# Patient Record
Sex: Female | Born: 1940 | Race: White | Hispanic: No | Marital: Married | Smoking: Never smoker
Health system: Southern US, Community
[De-identification: ages and names within clinical notes are randomized; demographics above are authoritative.]

## PROBLEM LIST (undated history)

## (undated) DIAGNOSIS — E119 Type 2 diabetes mellitus without complications: Secondary | ICD-10-CM

## (undated) DIAGNOSIS — I10 Essential (primary) hypertension: Secondary | ICD-10-CM

## (undated) HISTORY — DX: Essential (primary) hypertension: I10

## (undated) HISTORY — DX: Type 2 diabetes mellitus without complications: E11.9

---

## 2000-06-28 ENCOUNTER — Encounter: Admission: RE | Admit: 2000-06-28 | Discharge: 2000-06-28 | Payer: Self-pay | Admitting: Obstetrics and Gynecology

## 2000-06-28 ENCOUNTER — Encounter: Payer: Self-pay | Admitting: Obstetrics and Gynecology

## 2000-09-27 ENCOUNTER — Ambulatory Visit (HOSPITAL_BASED_OUTPATIENT_CLINIC_OR_DEPARTMENT_OTHER): Admission: RE | Admit: 2000-09-27 | Discharge: 2000-09-27 | Payer: Self-pay | Admitting: *Deleted

## 2001-08-08 ENCOUNTER — Encounter: Admission: RE | Admit: 2001-08-08 | Discharge: 2001-08-08 | Payer: Self-pay | Admitting: Obstetrics and Gynecology

## 2001-08-08 ENCOUNTER — Encounter: Payer: Self-pay | Admitting: Obstetrics and Gynecology

## 2001-12-12 ENCOUNTER — Ambulatory Visit (HOSPITAL_BASED_OUTPATIENT_CLINIC_OR_DEPARTMENT_OTHER): Admission: RE | Admit: 2001-12-12 | Discharge: 2001-12-12 | Payer: Self-pay | Admitting: *Deleted

## 2003-03-01 ENCOUNTER — Encounter: Admission: RE | Admit: 2003-03-01 | Discharge: 2003-03-01 | Payer: Self-pay | Admitting: Obstetrics and Gynecology

## 2003-03-01 ENCOUNTER — Encounter: Payer: Self-pay | Admitting: Obstetrics and Gynecology

## 2004-06-30 ENCOUNTER — Encounter: Admission: RE | Admit: 2004-06-30 | Discharge: 2004-06-30 | Payer: Self-pay | Admitting: Obstetrics and Gynecology

## 2004-09-25 ENCOUNTER — Ambulatory Visit (HOSPITAL_COMMUNITY): Admission: RE | Admit: 2004-09-25 | Discharge: 2004-09-25 | Payer: Self-pay | Admitting: Gastroenterology

## 2005-06-29 ENCOUNTER — Encounter: Admission: RE | Admit: 2005-06-29 | Discharge: 2005-06-29 | Payer: Self-pay | Admitting: Obstetrics and Gynecology

## 2006-05-06 ENCOUNTER — Encounter: Admission: RE | Admit: 2006-05-06 | Discharge: 2006-05-06 | Payer: Self-pay | Admitting: Obstetrics and Gynecology

## 2007-06-02 ENCOUNTER — Encounter: Admission: RE | Admit: 2007-06-02 | Discharge: 2007-06-02 | Payer: Self-pay | Admitting: Obstetrics and Gynecology

## 2008-05-31 ENCOUNTER — Encounter: Admission: RE | Admit: 2008-05-31 | Discharge: 2008-05-31 | Payer: Self-pay

## 2009-06-07 ENCOUNTER — Encounter: Admission: RE | Admit: 2009-06-07 | Discharge: 2009-06-07 | Payer: Self-pay

## 2010-05-08 ENCOUNTER — Encounter: Admission: RE | Admit: 2010-05-08 | Discharge: 2010-05-08 | Payer: Self-pay

## 2011-04-13 ENCOUNTER — Other Ambulatory Visit: Payer: Self-pay | Admitting: Family Medicine

## 2011-04-13 DIAGNOSIS — Z1231 Encounter for screening mammogram for malignant neoplasm of breast: Secondary | ICD-10-CM

## 2011-04-16 ENCOUNTER — Ambulatory Visit
Admission: RE | Admit: 2011-04-16 | Discharge: 2011-04-16 | Disposition: A | Discharge status: Home / Self Care | Payer: BC Managed Care – PPO | Source: Ambulatory Visit | Attending: Family Medicine | Admitting: Family Medicine

## 2011-04-16 DIAGNOSIS — Z1231 Encounter for screening mammogram for malignant neoplasm of breast: Secondary | ICD-10-CM

## 2011-05-11 NOTE — Op Note (Signed)
[place]. [facility]  Patient:    TALON, WITTING Visit Number: [id number] MRN: [id number]          Service Type: DSU Location: [facility] Attending Physician:  Aundria Mems Dictated by:   Kathy Breach, M.D. Proc. 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  OPERATION:  Ossicular reconstruction with a partial ossicular replacement prosthesis AD.  SURGEON:  Kathy Breach, M.D.  DESCRIPTION OF PROCEDURE:  The patient under general orotracheal anesthesia, the right ear was prepped and draped in sterile fashion.  The patient had a fairly small canal and examination of the tympanic membrane revealed normal-appearing anterior tympanic membrane.  The adherent previous incus rotation graft could be seen on the undersurface of the posterior tympanic membrane.  There was a 1 mm pinhole perforation at 9 oclock near the annulus.  An incision was made in the premargins of the ear lobe and a large piece of fat harvested and pressed for a press fat graft anticipated use.  The external canal previously infiltrated with 1% Xylocaine, 1:100,000 epinephrine for vasal constriction.  Tympanotomy flap was elevated in a posterior tympanum.  Adhesions were carefully removed.  The previous incus body rotation adherent on the undersurface of the tympanic membrane lifted up off of the stapes capitulum. It was dissected from the tympanic membrane resulting in a 2 mm perforation of the tympanic membrane where this was dissected away.  All middle ear adhesions were released.  Measurement of stapes capitulum to the posterior-superior bony annulus was about 4.5 mm.  This was sized with a tinfoil strip.  An [employer] shafted golden  burred PORP was selected, and under operating microscope for visualization, cut to size of the foil strip angulating the cut of the shaft slightly shorter towards the more sizeable portion of the superior head of the PORP.  The pressed fat graft was then inserted on the undersurface of the tympanic membrane spanning the perforation site from excision of the ossicle adherent to the undersurface of the tympanic membrane as well as the small pinhole perforation preexisting.  The PORP was then inserted, rotated into place visualizing through the shaft in place over the stapes capitulum.  The fat graft across the tympanic membrane perforation remained in position to visualization and checking.  The middle ear space was packed inferiorly and posteriorly with Gelfoam pledgets soaked with saline.  Tympanotomy flap turned back in position, noting slight fullness over the head of the PORP which was also occluding and holding the fat graft in position spanning the tympanic membrane perforation from dissecting the previous incus rotation away.  This was stabilized, packing the external canal with Gelfoam soaked with saline.  Sterile fluff cotton was placed in the external meatus.  The small incision of the ear lobe was repaired with interrupted 4-0 chromic catgut sutures.  The patient tolerated the procedure well.  She was taken to the stable, general condition.Dictated by:   Kathy Breach, M.D. Attending Physician:  Aundria Mems DD:  12/12/01 TD:  12/14/01 Job: [id number]

## 2011-05-11 NOTE — Op Note (Signed)
[name], Marshall               ACCOUNT NO.:  [id number]   MEDICAL RECORD NO.:  [id number]          PATIENT TYPE:  AMB   LOCATION:  ENDO                         FACILITY:  [facility]   PHYSICIAN:  Petra Kuba, M.D.    DATE OF BIRTH:  Mar 06, 1941   DATE OF PROCEDURE:  09/25/2004  DATE OF DISCHARGE:                                 OPERATIVE REPORT   PROCEDURE:  Colonoscopy.   INDICATIONS FOR PROCEDURE:  Guaiac positivity, family history of colon  cancer, due for colonic screening.   MEDICATIONS:  Demerol 60, Versed 6.   DESCRIPTION OF PROCEDURE:  Rectal inspection is pertinent for external  hemorrhoids.  Digital examination is negative.  The video colonoscope was  inserted and with rolling her on her back and abdominal pressure, fairly  easily advanced around the colon to the cecum.  On insertion, some left-  sided diverticuli were seen, but no other abnormality or sign of bleeding.  The cecum was identified by the appendiceal orifice and ileocecal valve.  The scope was slowly withdrawn.  The prep was adequate.  There was some  liquid stool that required washing and suctioning.  Cecum, ascending, and  transverse were normal.  The scope was withdrawn around the left side of the  colon and occasional diverticuli were seen, but no signs of bleeding,  polyps, or masses.  Anorectal pullthrough and retroflexion confirmed some  small hemorrhoids in the rectum.  The scope was straightened and readvanced  a short ways up the left side of the colon.  Air was suctioned and the scope  was removed.  The patient tolerated the procedure well.  There was no  obvious immediate complications.   ENDOSCOPIC ASSESSMENT:  1.  Internal and external hemorrhoids.  2.  Occasional left-sided diverticula.  3.  Otherwise within normal limits to the cecum.   PLAN:  Follow-up p.r.n. or in two months to recheck guaiacs.  Recheck colon  screening in five years.  Otherwise return care to S. Kyra Manges, M.D.  for  the customary health care maintenance and screening.       MEM/MEDQ  D:  09/25/2004  T:  09/25/2004  Job:  [id number]   cc:   S. Kyra Manges, M.D.  [phone] [address]  [place]  [place] [address]  Fax: [phone]

## 2011-05-11 NOTE — Op Note (Signed)
[place]  Patient:    Tiffany Marshall, Tiffany Marshall                      MRN: [id number] Proc. Date: 09/27/00 Adm. Date:  [id number] Attending:  Aundria Mems                           Operative Report  PREOPERATIVE DIAGNOSIS:  Ossicular discontinuity AD, with maximal conductive hearing loss status post previously and initially successful incus autograft ossicular reconstruction in 1998.  OPERATIVE PROCEDURE:  Revision incus autograft ossicular reconstruction AD.  POSTOPERATIVE DIAGNOSIS:  Ossicular discontinuity AD, with maximal conductive hearing loss status post previously and initially successful incus autograft ossicular reconstruction in 1998.  DESCRIPTION OF PROCEDURE:  With the patient under general aural tract anesthesia, the right ear is prepped and draped in a sterile fashion.  The canal skin was then infiltrated with 1% Xylocaine and 1:100,000 epinephrine. After careful examination of the small external canal, we were able to utilize a 5.5 mm speculum with no significant retraction, though thickened appearance of the posterosuperior quadrant of the tympanic membrane.  Tympanotomy flap was made from 12 oclock superiorly and 7 oclock inferiorly and elevated entering the middle ear space.  There was no remaining chorda tympani nerve visible.  The previous incus rotation-type ossicular reconstruction was noted to appear to have slipped superiorly off of the stapes capitulum that was dissected off of the undersurface of the tympanic membrane and it appeared as though that the short process of the incus had also completely eroded away, which had been positioned anteriorly.  The remaining head of the incus was removed after being completely dissected free.  The stapes was intact and freely mobile.  In elevating the tympanotomy flap, a linear tear from about 7 oclock towards the umbo occurred in the tympanic membrane.  A globule of fat was taken from  the right earlobe and pressed for later use in reinforcing the area of the tympanic membrane tear.  The measurement from the stapes capitulum to the overlying malleus long process was less than 3 mm and the position of malleus was just anterior to the capitulum, merely overlying it. The remaining incus graft could then be repositioned as a direct strut from the stapes capitulum to the undersurface of the upper portion of the malleus and was positioned as such and appeared to hold its position spontaneously and its position wedged between the undersurface of the malleus and the stapes capitulum.  The posterior superior and posterior anterior middle ear space was then packed with small pledgets of Gelfoam soaked with Cortisporin suspension. The pressed fat graft after being pressed gave the appearance of a nice thin fascia graft and was layed on the Gelfoam bed underneath the tear in the tympanic membrane for reinforcement and flap was returned back into its position and stabilized, packing external canal with Gelfoam pledgets soaked with Cortisporin suspension.  Sterile cotton was placed in the external meatus.  Cortisporin ointment was applied to the donor site incision of the earlobe, which was closed with interrupted 4-0 chromic catgut sutures.  The patient tolerated the procedure well and was taken to the recovery room in stable general condition. DD:  09/27/00 TD:  09/29/00 Job: [id number]

## 2012-03-14 ENCOUNTER — Other Ambulatory Visit: Payer: Self-pay | Admitting: Family Medicine

## 2012-03-14 DIAGNOSIS — Z1231 Encounter for screening mammogram for malignant neoplasm of breast: Secondary | ICD-10-CM

## 2012-03-21 ENCOUNTER — Ambulatory Visit
Admission: RE | Admit: 2012-03-21 | Discharge: 2012-03-21 | Disposition: A | Discharge status: Home / Self Care | Payer: BC Managed Care – PPO | Source: Ambulatory Visit | Attending: Family Medicine | Admitting: Family Medicine

## 2012-03-21 DIAGNOSIS — Z1231 Encounter for screening mammogram for malignant neoplasm of breast: Secondary | ICD-10-CM

## 2013-03-11 ENCOUNTER — Other Ambulatory Visit: Payer: Self-pay

## 2013-03-11 DIAGNOSIS — Z1231 Encounter for screening mammogram for malignant neoplasm of breast: Secondary | ICD-10-CM

## 2013-04-07 ENCOUNTER — Ambulatory Visit
Admission: RE | Admit: 2013-04-07 | Discharge: 2013-04-07 | Disposition: A | Discharge status: Home / Self Care | Payer: BC Managed Care – PPO | Source: Ambulatory Visit

## 2013-04-07 DIAGNOSIS — Z1231 Encounter for screening mammogram for malignant neoplasm of breast: Secondary | ICD-10-CM

## 2014-04-26 ENCOUNTER — Other Ambulatory Visit: Payer: Self-pay

## 2014-04-26 DIAGNOSIS — Z1231 Encounter for screening mammogram for malignant neoplasm of breast: Secondary | ICD-10-CM

## 2014-05-03 ENCOUNTER — Ambulatory Visit
Admission: RE | Admit: 2014-05-03 | Discharge: 2014-05-03 | Disposition: A | Discharge status: Home / Self Care | Payer: BC Managed Care – PPO | Source: Ambulatory Visit

## 2014-05-03 DIAGNOSIS — Z1231 Encounter for screening mammogram for malignant neoplasm of breast: Secondary | ICD-10-CM

## 2014-09-21 ENCOUNTER — Ambulatory Visit: Payer: Self-pay | Admitting: Podiatrist

## 2014-10-12 ENCOUNTER — Ambulatory Visit (INDEPENDENT_AMBULATORY_CARE_PROVIDER_SITE_OTHER): Payer: BC Managed Care – PPO

## 2014-10-12 ENCOUNTER — Encounter: Payer: Self-pay | Admitting: Podiatrist

## 2014-10-12 ENCOUNTER — Ambulatory Visit (INDEPENDENT_AMBULATORY_CARE_PROVIDER_SITE_OTHER): Payer: BC Managed Care – PPO | Admitting: Podiatrist

## 2014-10-12 VITALS — BP 131/66 | HR 67 | Resp 12

## 2014-10-12 DIAGNOSIS — M65271 Calcific tendinitis, right ankle and foot: Secondary | ICD-10-CM

## 2014-10-12 DIAGNOSIS — M722 Plantar fascial fibromatosis: Secondary | ICD-10-CM

## 2014-10-12 DIAGNOSIS — R52 Pain, unspecified: Secondary | ICD-10-CM

## 2014-10-12 MED ORDER — DICLOFENAC SODIUM 1 % TD GEL
2.0000 g | Freq: Four times a day (QID) | TRANSDERMAL | Status: AC
Start: 2014-10-12 — End: ?

## 2014-10-12 NOTE — Patient Instructions (Signed)

## 2014-10-12 NOTE — Progress Notes (Signed)
   Subjective:    Patient ID: Tiffany Marshall, female    DOB: July 06, 1941, 73 y.o.   MRN: [id number]  HPI  PT STATED RT OUTSIDE  AND ARCH OF THE FOOT IS SORE FOR 3 MONTHS.  FOOT IS GETTING BETTER AND GET AGGRAVATED BY WALKING. TRIED TAKING PREDNISONE AND IT HELP   Review of Systems  All other systems reviewed and are negative.      Objective:   Physical Exam  Patient is awake, alert, and oriented x 3.  In no acute distress.  Vascular status is intact with palpable pedal pulses at 2/4 DP and PT bilateral and capillary refill time within normal limits. Neurological sensation is also intact bilaterally via Semmes Weinstein monofilament at 5/5 sites. Light touch, vibratory sensation, Achilles tendon reflex is intact. Dermatological exam reveals skin color, turger and texture as normal. No open lesions present.  Musculature intact with dorsiflexion, plantarflexion, inversion, eversion.  Pes planus foot type is noted.  Mild discomfort on the lateral aspect of the right foot is noted likely from compensation from her arch pain that improved after taking a steroid pack.  No weakness or tenderness along the peroneus brevis is noted.    xrays show a small enthesopathy on the base of the fifth metatarsal.  Minimal spur on the plantar heel is noted.  Pes planus foot type is seen.      Assessment & Plan:  Tendonitis, capsulitis, compensation right foot lateral  Plan:  Recommended stretching, good shoes, and voltaren gel.  At this time it does not warrant an injection.  She will call if the pain returns.

## 2015-06-22 ENCOUNTER — Other Ambulatory Visit: Payer: Self-pay

## 2015-06-22 DIAGNOSIS — Z1231 Encounter for screening mammogram for malignant neoplasm of breast: Secondary | ICD-10-CM

## 2015-06-30 ENCOUNTER — Ambulatory Visit: Payer: Self-pay

## 2015-06-30 ENCOUNTER — Ambulatory Visit
Admission: RE | Admit: 2015-06-30 | Discharge: 2015-06-30 | Disposition: A | Discharge status: Home / Self Care | Payer: BLUE CROSS/BLUE SHIELD | Source: Ambulatory Visit

## 2015-06-30 DIAGNOSIS — Z1231 Encounter for screening mammogram for malignant neoplasm of breast: Secondary | ICD-10-CM

## 2016-06-04 ENCOUNTER — Other Ambulatory Visit: Payer: Self-pay | Admitting: Sports Medicine

## 2016-06-04 ENCOUNTER — Ambulatory Visit
Admission: RE | Admit: 2016-06-04 | Discharge: 2016-06-04 | Disposition: A | Discharge status: Home / Self Care | Payer: BLUE CROSS/BLUE SHIELD | Source: Ambulatory Visit | Attending: Family Medicine | Admitting: Family Medicine

## 2016-06-04 ENCOUNTER — Other Ambulatory Visit: Payer: Self-pay | Admitting: Family Medicine

## 2016-06-04 DIAGNOSIS — Z1231 Encounter for screening mammogram for malignant neoplasm of breast: Secondary | ICD-10-CM

## 2017-03-08 DIAGNOSIS — Y93H2 Activity, gardening and landscaping: Secondary | ICD-10-CM | POA: Diagnosis not present

## 2017-03-08 DIAGNOSIS — S4991XA Unspecified injury of right shoulder and upper arm, initial encounter: Secondary | ICD-10-CM | POA: Diagnosis not present

## 2017-03-08 DIAGNOSIS — W19XXXA Unspecified fall, initial encounter: Secondary | ICD-10-CM | POA: Diagnosis not present

## 2017-03-08 DIAGNOSIS — M79601 Pain in right arm: Secondary | ICD-10-CM | POA: Diagnosis not present

## 2017-03-08 DIAGNOSIS — S01411A Laceration without foreign body of right cheek and temporomandibular area, initial encounter: Secondary | ICD-10-CM | POA: Diagnosis not present

## 2017-03-08 DIAGNOSIS — S0121XA Laceration without foreign body of nose, initial encounter: Secondary | ICD-10-CM | POA: Diagnosis not present

## 2017-05-22 ENCOUNTER — Other Ambulatory Visit: Payer: Self-pay | Admitting: Family Medicine

## 2017-05-22 DIAGNOSIS — Z1231 Encounter for screening mammogram for malignant neoplasm of breast: Secondary | ICD-10-CM

## 2017-06-11 ENCOUNTER — Ambulatory Visit
Admission: RE | Admit: 2017-06-11 | Discharge: 2017-06-11 | Disposition: A | Discharge status: Home / Self Care | Payer: PPO | Source: Ambulatory Visit | Attending: Family Medicine | Admitting: Family Medicine

## 2017-06-11 DIAGNOSIS — Z1231 Encounter for screening mammogram for malignant neoplasm of breast: Secondary | ICD-10-CM

## 2017-07-03 ENCOUNTER — Other Ambulatory Visit: Payer: Self-pay

## 2017-07-03 NOTE — Patient Outreach (Signed)
[employer] questionnaire screening:  07/02/2017 Placed call to patient for screening. Explained reason for call. Confirmed identity.  Reviewed answers to questionnaire. Discussed DM with patient. Patient reports that she is pre diabetes. States that she is managed by diet. Denies taking any medications for DM. States that her CBG runs in the 100's.  Hypertension:  Patient reports that she has no signs or symptoms of high blood pressure. States that she does not home monitor.   Medications: denies any problems or concerns with current medications.   Reviewed any current and patient states that she is doing well.  Offered to mail letter with magnet and patient has agreed.  Confirmed address. Encouraged patient to call [employer] for any needs in the future. She agreed.  Tomasa Rand, RN, BSN, C[facility] [employer] [phone]

## 2017-10-07 DIAGNOSIS — H8081 Other otosclerosis, right ear: Secondary | ICD-10-CM | POA: Diagnosis not present

## 2017-10-07 DIAGNOSIS — H903 Sensorineural hearing loss, bilateral: Secondary | ICD-10-CM | POA: Diagnosis not present

## 2017-10-07 DIAGNOSIS — H73891 Other specified disorders of tympanic membrane, right ear: Secondary | ICD-10-CM | POA: Diagnosis not present

## 2017-10-07 DIAGNOSIS — H8001 Otosclerosis involving oval window, nonobliterative, right ear: Secondary | ICD-10-CM | POA: Diagnosis not present

## 2017-10-15 DIAGNOSIS — Z800 Family history of malignant neoplasm of digestive organs: Secondary | ICD-10-CM | POA: Diagnosis not present

## 2017-10-15 DIAGNOSIS — K625 Hemorrhage of anus and rectum: Secondary | ICD-10-CM | POA: Diagnosis not present

## 2017-10-15 DIAGNOSIS — R198 Other specified symptoms and signs involving the digestive system and abdomen: Secondary | ICD-10-CM | POA: Diagnosis not present

## 2017-10-28 DIAGNOSIS — E669 Obesity, unspecified: Secondary | ICD-10-CM | POA: Diagnosis not present

## 2017-10-28 DIAGNOSIS — K648 Other hemorrhoids: Secondary | ICD-10-CM | POA: Diagnosis not present

## 2017-10-28 DIAGNOSIS — E785 Hyperlipidemia, unspecified: Secondary | ICD-10-CM | POA: Diagnosis not present

## 2017-10-28 DIAGNOSIS — K649 Unspecified hemorrhoids: Secondary | ICD-10-CM | POA: Diagnosis not present

## 2017-10-28 DIAGNOSIS — Z79899 Other long term (current) drug therapy: Secondary | ICD-10-CM | POA: Diagnosis not present

## 2017-10-28 DIAGNOSIS — K5120 Ulcerative (chronic) proctitis without complications: Secondary | ICD-10-CM | POA: Diagnosis not present

## 2017-10-28 DIAGNOSIS — R198 Other specified symptoms and signs involving the digestive system and abdomen: Secondary | ICD-10-CM | POA: Diagnosis not present

## 2017-10-28 DIAGNOSIS — K625 Hemorrhage of anus and rectum: Secondary | ICD-10-CM | POA: Diagnosis not present

## 2017-10-28 DIAGNOSIS — E119 Type 2 diabetes mellitus without complications: Secondary | ICD-10-CM | POA: Diagnosis not present

## 2017-10-28 DIAGNOSIS — Z800 Family history of malignant neoplasm of digestive organs: Secondary | ICD-10-CM | POA: Diagnosis not present

## 2017-10-28 DIAGNOSIS — K645 Perianal venous thrombosis: Secondary | ICD-10-CM | POA: Diagnosis not present

## 2017-10-28 DIAGNOSIS — Z6837 Body mass index (BMI) 37.0-37.9, adult: Secondary | ICD-10-CM | POA: Diagnosis not present

## 2017-10-28 DIAGNOSIS — Z86010 Personal history of colonic polyps: Secondary | ICD-10-CM | POA: Diagnosis not present

## 2017-10-28 DIAGNOSIS — K5730 Diverticulosis of large intestine without perforation or abscess without bleeding: Secondary | ICD-10-CM | POA: Diagnosis not present

## 2017-10-28 DIAGNOSIS — I10 Essential (primary) hypertension: Secondary | ICD-10-CM | POA: Diagnosis not present

## 2017-10-31 DIAGNOSIS — Z9181 History of falling: Secondary | ICD-10-CM | POA: Diagnosis not present

## 2017-10-31 DIAGNOSIS — E559 Vitamin D deficiency, unspecified: Secondary | ICD-10-CM | POA: Diagnosis not present

## 2017-10-31 DIAGNOSIS — R8290 Unspecified abnormal findings in urine: Secondary | ICD-10-CM | POA: Diagnosis not present

## 2017-10-31 DIAGNOSIS — E785 Hyperlipidemia, unspecified: Secondary | ICD-10-CM | POA: Diagnosis not present

## 2017-10-31 DIAGNOSIS — N182 Chronic kidney disease, stage 2 (mild): Secondary | ICD-10-CM | POA: Diagnosis not present

## 2017-10-31 DIAGNOSIS — Z1389 Encounter for screening for other disorder: Secondary | ICD-10-CM | POA: Diagnosis not present

## 2017-10-31 DIAGNOSIS — Z0000 Encounter for general adult medical examination without abnormal findings: Secondary | ICD-10-CM | POA: Diagnosis not present

## 2017-10-31 DIAGNOSIS — I129 Hypertensive chronic kidney disease with stage 1 through stage 4 chronic kidney disease, or unspecified chronic kidney disease: Secondary | ICD-10-CM | POA: Diagnosis not present

## 2017-10-31 DIAGNOSIS — Z1331 Encounter for screening for depression: Secondary | ICD-10-CM | POA: Diagnosis not present

## 2017-10-31 DIAGNOSIS — E1122 Type 2 diabetes mellitus with diabetic chronic kidney disease: Secondary | ICD-10-CM | POA: Diagnosis not present

## 2017-10-31 DIAGNOSIS — E669 Obesity, unspecified: Secondary | ICD-10-CM | POA: Diagnosis not present

## 2017-11-07 DIAGNOSIS — L84 Corns and callosities: Secondary | ICD-10-CM | POA: Diagnosis not present

## 2017-11-07 DIAGNOSIS — L600 Ingrowing nail: Secondary | ICD-10-CM | POA: Diagnosis not present

## 2017-11-11 DIAGNOSIS — N182 Chronic kidney disease, stage 2 (mild): Secondary | ICD-10-CM | POA: Diagnosis not present

## 2017-11-11 DIAGNOSIS — I129 Hypertensive chronic kidney disease with stage 1 through stage 4 chronic kidney disease, or unspecified chronic kidney disease: Secondary | ICD-10-CM | POA: Diagnosis not present

## 2017-11-11 DIAGNOSIS — E559 Vitamin D deficiency, unspecified: Secondary | ICD-10-CM | POA: Diagnosis not present

## 2017-11-11 DIAGNOSIS — E785 Hyperlipidemia, unspecified: Secondary | ICD-10-CM | POA: Diagnosis not present

## 2017-11-11 DIAGNOSIS — E1122 Type 2 diabetes mellitus with diabetic chronic kidney disease: Secondary | ICD-10-CM | POA: Diagnosis not present

## 2017-11-21 DIAGNOSIS — H8001 Otosclerosis involving oval window, nonobliterative, right ear: Secondary | ICD-10-CM | POA: Diagnosis not present

## 2017-11-21 DIAGNOSIS — H73891 Other specified disorders of tympanic membrane, right ear: Secondary | ICD-10-CM | POA: Diagnosis not present

## 2017-11-21 DIAGNOSIS — H903 Sensorineural hearing loss, bilateral: Secondary | ICD-10-CM | POA: Diagnosis not present

## 2017-11-21 DIAGNOSIS — H8081 Other otosclerosis, right ear: Secondary | ICD-10-CM | POA: Diagnosis not present

## 2017-12-05 DIAGNOSIS — L600 Ingrowing nail: Secondary | ICD-10-CM | POA: Diagnosis not present

## 2018-06-19 DIAGNOSIS — E785 Hyperlipidemia, unspecified: Secondary | ICD-10-CM | POA: Diagnosis not present

## 2018-06-19 DIAGNOSIS — Z1231 Encounter for screening mammogram for malignant neoplasm of breast: Secondary | ICD-10-CM | POA: Diagnosis not present

## 2018-06-19 DIAGNOSIS — I129 Hypertensive chronic kidney disease with stage 1 through stage 4 chronic kidney disease, or unspecified chronic kidney disease: Secondary | ICD-10-CM | POA: Diagnosis not present

## 2018-06-19 DIAGNOSIS — E559 Vitamin D deficiency, unspecified: Secondary | ICD-10-CM | POA: Diagnosis not present

## 2018-06-19 DIAGNOSIS — E1122 Type 2 diabetes mellitus with diabetic chronic kidney disease: Secondary | ICD-10-CM | POA: Diagnosis not present

## 2018-06-27 ENCOUNTER — Other Ambulatory Visit: Payer: Self-pay | Admitting: Family Medicine

## 2018-06-27 DIAGNOSIS — Z1231 Encounter for screening mammogram for malignant neoplasm of breast: Secondary | ICD-10-CM

## 2018-07-10 DIAGNOSIS — H20031 Secondary infectious iridocyclitis, right eye: Secondary | ICD-10-CM | POA: Diagnosis not present

## 2018-07-11 DIAGNOSIS — H20031 Secondary infectious iridocyclitis, right eye: Secondary | ICD-10-CM | POA: Diagnosis not present

## 2018-07-14 DIAGNOSIS — S0501XA Injury of conjunctiva and corneal abrasion without foreign body, right eye, initial encounter: Secondary | ICD-10-CM | POA: Diagnosis not present

## 2018-07-21 ENCOUNTER — Ambulatory Visit
Admission: RE | Admit: 2018-07-21 | Discharge: 2018-07-21 | Disposition: A | Discharge status: Home / Self Care | Payer: PPO | Source: Ambulatory Visit | Attending: Family Medicine | Admitting: Family Medicine

## 2018-07-21 DIAGNOSIS — Z1231 Encounter for screening mammogram for malignant neoplasm of breast: Secondary | ICD-10-CM

## 2018-09-29 DIAGNOSIS — N390 Urinary tract infection, site not specified: Secondary | ICD-10-CM | POA: Diagnosis not present

## 2018-09-29 DIAGNOSIS — R05 Cough: Secondary | ICD-10-CM | POA: Diagnosis not present

## 2018-09-29 DIAGNOSIS — E559 Vitamin D deficiency, unspecified: Secondary | ICD-10-CM | POA: Diagnosis not present

## 2018-09-29 DIAGNOSIS — E785 Hyperlipidemia, unspecified: Secondary | ICD-10-CM | POA: Diagnosis not present

## 2018-09-29 DIAGNOSIS — Z23 Encounter for immunization: Secondary | ICD-10-CM | POA: Diagnosis not present

## 2018-09-29 DIAGNOSIS — Z1339 Encounter for screening examination for other mental health and behavioral disorders: Secondary | ICD-10-CM | POA: Diagnosis not present

## 2018-09-29 DIAGNOSIS — I129 Hypertensive chronic kidney disease with stage 1 through stage 4 chronic kidney disease, or unspecified chronic kidney disease: Secondary | ICD-10-CM | POA: Diagnosis not present

## 2018-09-29 DIAGNOSIS — E1122 Type 2 diabetes mellitus with diabetic chronic kidney disease: Secondary | ICD-10-CM | POA: Diagnosis not present

## 2018-09-29 DIAGNOSIS — Z139 Encounter for screening, unspecified: Secondary | ICD-10-CM | POA: Diagnosis not present

## 2019-06-17 ENCOUNTER — Other Ambulatory Visit: Payer: Self-pay | Admitting: Family Medicine

## 2019-06-17 DIAGNOSIS — Z1231 Encounter for screening mammogram for malignant neoplasm of breast: Secondary | ICD-10-CM

## 2019-08-03 ENCOUNTER — Other Ambulatory Visit: Payer: Self-pay

## 2019-08-03 ENCOUNTER — Ambulatory Visit
Admission: RE | Admit: 2019-08-03 | Discharge: 2019-08-03 | Disposition: A | Discharge status: Home / Self Care | Payer: PPO | Source: Ambulatory Visit | Attending: Family Medicine | Admitting: Family Medicine

## 2019-08-03 DIAGNOSIS — Z1231 Encounter for screening mammogram for malignant neoplasm of breast: Secondary | ICD-10-CM

## 2019-08-18 DIAGNOSIS — D1722 Benign lipomatous neoplasm of skin and subcutaneous tissue of left arm: Secondary | ICD-10-CM | POA: Diagnosis not present

## 2019-08-18 DIAGNOSIS — N182 Chronic kidney disease, stage 2 (mild): Secondary | ICD-10-CM | POA: Diagnosis not present

## 2019-08-18 DIAGNOSIS — E559 Vitamin D deficiency, unspecified: Secondary | ICD-10-CM | POA: Diagnosis not present

## 2019-08-18 DIAGNOSIS — E1122 Type 2 diabetes mellitus with diabetic chronic kidney disease: Secondary | ICD-10-CM | POA: Diagnosis not present

## 2019-08-18 DIAGNOSIS — Z1331 Encounter for screening for depression: Secondary | ICD-10-CM | POA: Diagnosis not present

## 2019-08-18 DIAGNOSIS — I129 Hypertensive chronic kidney disease with stage 1 through stage 4 chronic kidney disease, or unspecified chronic kidney disease: Secondary | ICD-10-CM | POA: Diagnosis not present

## 2019-08-18 DIAGNOSIS — E785 Hyperlipidemia, unspecified: Secondary | ICD-10-CM | POA: Diagnosis not present

## 2019-08-18 DIAGNOSIS — Z9181 History of falling: Secondary | ICD-10-CM | POA: Diagnosis not present

## 2019-09-28 DIAGNOSIS — E785 Hyperlipidemia, unspecified: Secondary | ICD-10-CM | POA: Diagnosis not present

## 2019-09-28 DIAGNOSIS — E669 Obesity, unspecified: Secondary | ICD-10-CM | POA: Diagnosis not present

## 2019-09-28 DIAGNOSIS — Z6837 Body mass index (BMI) 37.0-37.9, adult: Secondary | ICD-10-CM | POA: Diagnosis not present

## 2019-09-28 DIAGNOSIS — Z1331 Encounter for screening for depression: Secondary | ICD-10-CM | POA: Diagnosis not present

## 2019-09-28 DIAGNOSIS — Z1231 Encounter for screening mammogram for malignant neoplasm of breast: Secondary | ICD-10-CM | POA: Diagnosis not present

## 2019-09-28 DIAGNOSIS — Z0000 Encounter for general adult medical examination without abnormal findings: Secondary | ICD-10-CM | POA: Diagnosis not present

## 2019-09-28 DIAGNOSIS — Z136 Encounter for screening for cardiovascular disorders: Secondary | ICD-10-CM | POA: Diagnosis not present

## 2019-09-28 DIAGNOSIS — N959 Unspecified menopausal and perimenopausal disorder: Secondary | ICD-10-CM | POA: Diagnosis not present

## 2019-09-28 DIAGNOSIS — Z9181 History of falling: Secondary | ICD-10-CM | POA: Diagnosis not present

## 2020-03-14 DIAGNOSIS — I129 Hypertensive chronic kidney disease with stage 1 through stage 4 chronic kidney disease, or unspecified chronic kidney disease: Secondary | ICD-10-CM | POA: Diagnosis not present

## 2020-03-14 DIAGNOSIS — E559 Vitamin D deficiency, unspecified: Secondary | ICD-10-CM | POA: Diagnosis not present

## 2020-03-14 DIAGNOSIS — E785 Hyperlipidemia, unspecified: Secondary | ICD-10-CM | POA: Diagnosis not present

## 2020-03-14 DIAGNOSIS — N182 Chronic kidney disease, stage 2 (mild): Secondary | ICD-10-CM | POA: Diagnosis not present

## 2020-03-14 DIAGNOSIS — Z6838 Body mass index (BMI) 38.0-38.9, adult: Secondary | ICD-10-CM | POA: Diagnosis not present

## 2020-03-14 DIAGNOSIS — E1122 Type 2 diabetes mellitus with diabetic chronic kidney disease: Secondary | ICD-10-CM | POA: Diagnosis not present

## 2020-04-18 DIAGNOSIS — R7989 Other specified abnormal findings of blood chemistry: Secondary | ICD-10-CM | POA: Diagnosis not present

## 2020-06-23 ENCOUNTER — Other Ambulatory Visit: Payer: Self-pay | Admitting: Family Medicine

## 2020-06-23 DIAGNOSIS — Z1231 Encounter for screening mammogram for malignant neoplasm of breast: Secondary | ICD-10-CM

## 2020-07-06 DIAGNOSIS — N182 Chronic kidney disease, stage 2 (mild): Secondary | ICD-10-CM | POA: Diagnosis not present

## 2020-07-06 DIAGNOSIS — E559 Vitamin D deficiency, unspecified: Secondary | ICD-10-CM | POA: Diagnosis not present

## 2020-07-06 DIAGNOSIS — I129 Hypertensive chronic kidney disease with stage 1 through stage 4 chronic kidney disease, or unspecified chronic kidney disease: Secondary | ICD-10-CM | POA: Diagnosis not present

## 2020-07-06 DIAGNOSIS — Z6837 Body mass index (BMI) 37.0-37.9, adult: Secondary | ICD-10-CM | POA: Diagnosis not present

## 2020-07-06 DIAGNOSIS — E1122 Type 2 diabetes mellitus with diabetic chronic kidney disease: Secondary | ICD-10-CM | POA: Diagnosis not present

## 2020-07-06 DIAGNOSIS — E785 Hyperlipidemia, unspecified: Secondary | ICD-10-CM | POA: Diagnosis not present

## 2020-07-06 DIAGNOSIS — R21 Rash and other nonspecific skin eruption: Secondary | ICD-10-CM | POA: Diagnosis not present

## 2020-07-19 ENCOUNTER — Other Ambulatory Visit: Payer: Self-pay

## 2020-08-08 ENCOUNTER — Ambulatory Visit
Admission: RE | Admit: 2020-08-08 | Discharge: 2020-08-08 | Disposition: A | Discharge status: Home / Self Care | Payer: PPO | Source: Ambulatory Visit | Attending: Family Medicine | Admitting: Family Medicine

## 2020-08-08 ENCOUNTER — Other Ambulatory Visit: Payer: Self-pay

## 2020-08-08 DIAGNOSIS — Z1231 Encounter for screening mammogram for malignant neoplasm of breast: Secondary | ICD-10-CM

## 2020-08-10 ENCOUNTER — Other Ambulatory Visit: Payer: Self-pay | Admitting: Family Medicine

## 2020-08-10 DIAGNOSIS — R928 Other abnormal and inconclusive findings on diagnostic imaging of breast: Secondary | ICD-10-CM

## 2020-08-22 ENCOUNTER — Other Ambulatory Visit: Payer: Self-pay

## 2020-08-22 ENCOUNTER — Ambulatory Visit: Payer: PPO

## 2020-08-22 ENCOUNTER — Ambulatory Visit
Admission: RE | Admit: 2020-08-22 | Discharge: 2020-08-22 | Disposition: A | Discharge status: Home / Self Care | Payer: PPO | Source: Ambulatory Visit | Attending: Family Medicine | Admitting: Family Medicine

## 2020-08-22 DIAGNOSIS — R928 Other abnormal and inconclusive findings on diagnostic imaging of breast: Secondary | ICD-10-CM | POA: Diagnosis not present

## 2020-10-04 DIAGNOSIS — E785 Hyperlipidemia, unspecified: Secondary | ICD-10-CM | POA: Diagnosis not present

## 2020-10-04 DIAGNOSIS — Z9181 History of falling: Secondary | ICD-10-CM | POA: Diagnosis not present

## 2020-10-04 DIAGNOSIS — E669 Obesity, unspecified: Secondary | ICD-10-CM | POA: Diagnosis not present

## 2020-10-04 DIAGNOSIS — Z1331 Encounter for screening for depression: Secondary | ICD-10-CM | POA: Diagnosis not present

## 2020-10-04 DIAGNOSIS — Z139 Encounter for screening, unspecified: Secondary | ICD-10-CM | POA: Diagnosis not present

## 2020-10-04 DIAGNOSIS — Z0000 Encounter for general adult medical examination without abnormal findings: Secondary | ICD-10-CM | POA: Diagnosis not present

## 2020-11-07 DIAGNOSIS — E1122 Type 2 diabetes mellitus with diabetic chronic kidney disease: Secondary | ICD-10-CM | POA: Diagnosis not present

## 2020-11-07 DIAGNOSIS — Z6837 Body mass index (BMI) 37.0-37.9, adult: Secondary | ICD-10-CM | POA: Diagnosis not present

## 2020-11-07 DIAGNOSIS — E785 Hyperlipidemia, unspecified: Secondary | ICD-10-CM | POA: Diagnosis not present

## 2020-11-07 DIAGNOSIS — I129 Hypertensive chronic kidney disease with stage 1 through stage 4 chronic kidney disease, or unspecified chronic kidney disease: Secondary | ICD-10-CM | POA: Diagnosis not present

## 2020-11-07 DIAGNOSIS — N182 Chronic kidney disease, stage 2 (mild): Secondary | ICD-10-CM | POA: Diagnosis not present

## 2020-11-07 DIAGNOSIS — E559 Vitamin D deficiency, unspecified: Secondary | ICD-10-CM | POA: Diagnosis not present

## 2021-02-14 DIAGNOSIS — H6121 Impacted cerumen, right ear: Secondary | ICD-10-CM | POA: Diagnosis not present

## 2021-02-14 DIAGNOSIS — Z6838 Body mass index (BMI) 38.0-38.9, adult: Secondary | ICD-10-CM | POA: Diagnosis not present

## 2021-07-14 ENCOUNTER — Other Ambulatory Visit: Payer: Self-pay | Admitting: Internal Medicine

## 2021-07-14 DIAGNOSIS — Z1231 Encounter for screening mammogram for malignant neoplasm of breast: Secondary | ICD-10-CM

## 2021-08-03 DIAGNOSIS — E1122 Type 2 diabetes mellitus with diabetic chronic kidney disease: Secondary | ICD-10-CM | POA: Diagnosis not present

## 2021-08-03 DIAGNOSIS — Z6837 Body mass index (BMI) 37.0-37.9, adult: Secondary | ICD-10-CM | POA: Diagnosis not present

## 2021-08-03 DIAGNOSIS — N182 Chronic kidney disease, stage 2 (mild): Secondary | ICD-10-CM | POA: Diagnosis not present

## 2021-08-03 DIAGNOSIS — E1140 Type 2 diabetes mellitus with diabetic neuropathy, unspecified: Secondary | ICD-10-CM | POA: Diagnosis not present

## 2021-08-03 DIAGNOSIS — Z23 Encounter for immunization: Secondary | ICD-10-CM | POA: Diagnosis not present

## 2021-08-03 DIAGNOSIS — E559 Vitamin D deficiency, unspecified: Secondary | ICD-10-CM | POA: Diagnosis not present

## 2021-08-03 DIAGNOSIS — I129 Hypertensive chronic kidney disease with stage 1 through stage 4 chronic kidney disease, or unspecified chronic kidney disease: Secondary | ICD-10-CM | POA: Diagnosis not present

## 2021-08-03 DIAGNOSIS — E785 Hyperlipidemia, unspecified: Secondary | ICD-10-CM | POA: Diagnosis not present

## 2021-08-03 DIAGNOSIS — H04203 Unspecified epiphora, bilateral lacrimal glands: Secondary | ICD-10-CM | POA: Diagnosis not present

## 2021-08-17 DIAGNOSIS — E1122 Type 2 diabetes mellitus with diabetic chronic kidney disease: Secondary | ICD-10-CM | POA: Diagnosis not present

## 2021-09-11 ENCOUNTER — Other Ambulatory Visit: Payer: Self-pay

## 2021-09-11 ENCOUNTER — Ambulatory Visit
Admission: RE | Admit: 2021-09-11 | Discharge: 2021-09-11 | Disposition: A | Discharge status: Home / Self Care | Payer: PPO | Source: Ambulatory Visit | Attending: Internal Medicine | Admitting: Internal Medicine

## 2021-09-11 DIAGNOSIS — Z1231 Encounter for screening mammogram for malignant neoplasm of breast: Secondary | ICD-10-CM

## 2021-10-05 DIAGNOSIS — E785 Hyperlipidemia, unspecified: Secondary | ICD-10-CM | POA: Diagnosis not present

## 2021-10-05 DIAGNOSIS — E669 Obesity, unspecified: Secondary | ICD-10-CM | POA: Diagnosis not present

## 2021-10-05 DIAGNOSIS — Z0000 Encounter for general adult medical examination without abnormal findings: Secondary | ICD-10-CM | POA: Diagnosis not present

## 2021-10-05 DIAGNOSIS — Z9181 History of falling: Secondary | ICD-10-CM | POA: Diagnosis not present

## 2021-10-05 DIAGNOSIS — Z1331 Encounter for screening for depression: Secondary | ICD-10-CM | POA: Diagnosis not present

## 2021-12-12 IMAGING — MG DIGITAL SCREENING BILAT W/ TOMO W/ CAD
8 series · 8 of 24 positions shown · non-contrast
Comparison: Previous exam(s).

CLINICAL DATA: Screening.

EXAM:
DIGITAL SCREENING BILATERAL MAMMOGRAM WITH TOMO AND CAD

[R CC synth-2D]
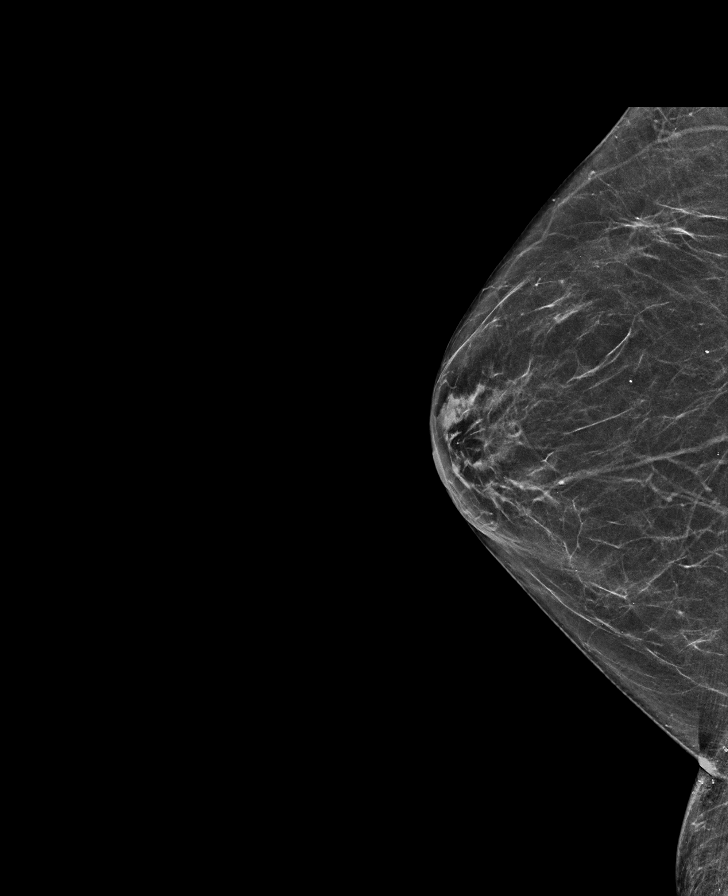

[L CC synth-2D]
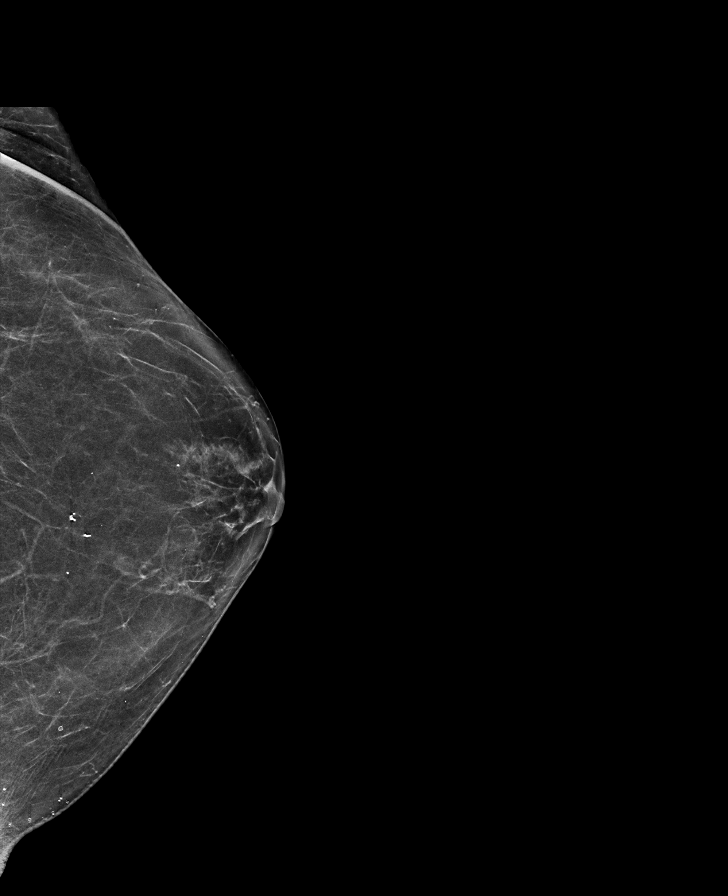

[R MLO synth-2D]
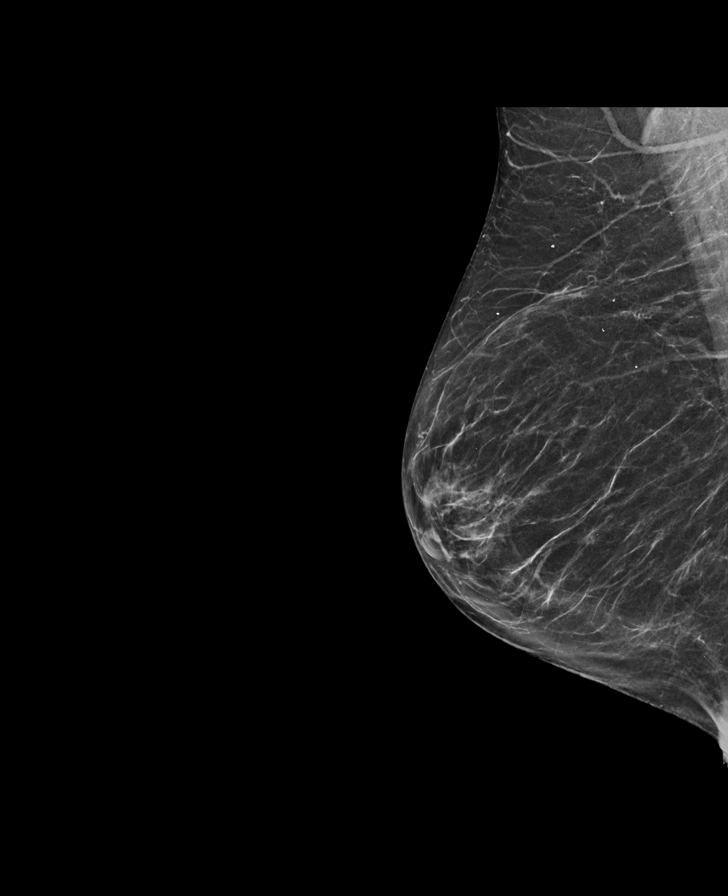

[L MLO synth-2D]
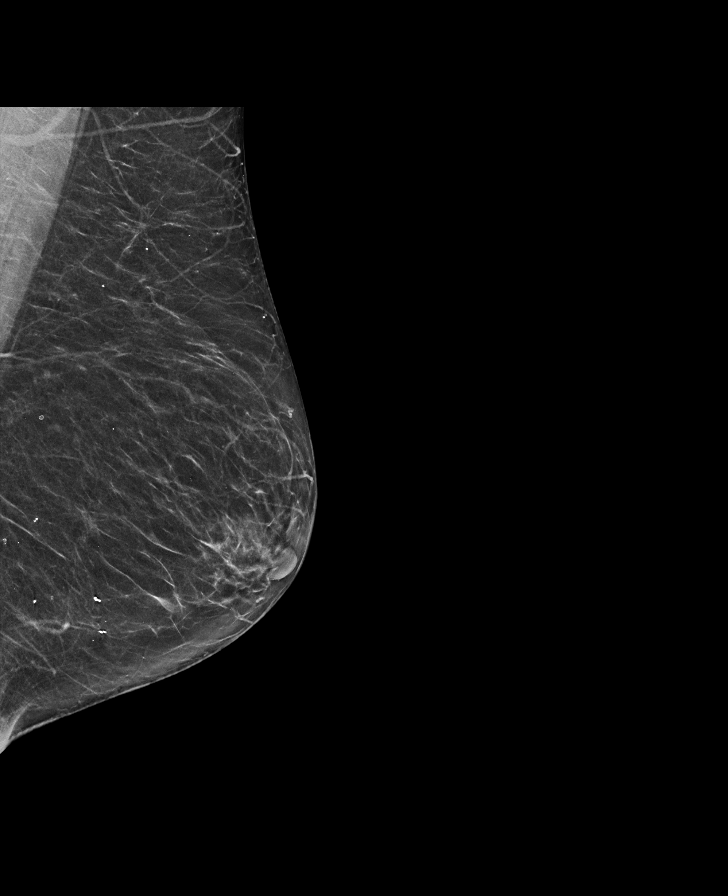

[R MLO tomo · tomo slice 32/63.0]
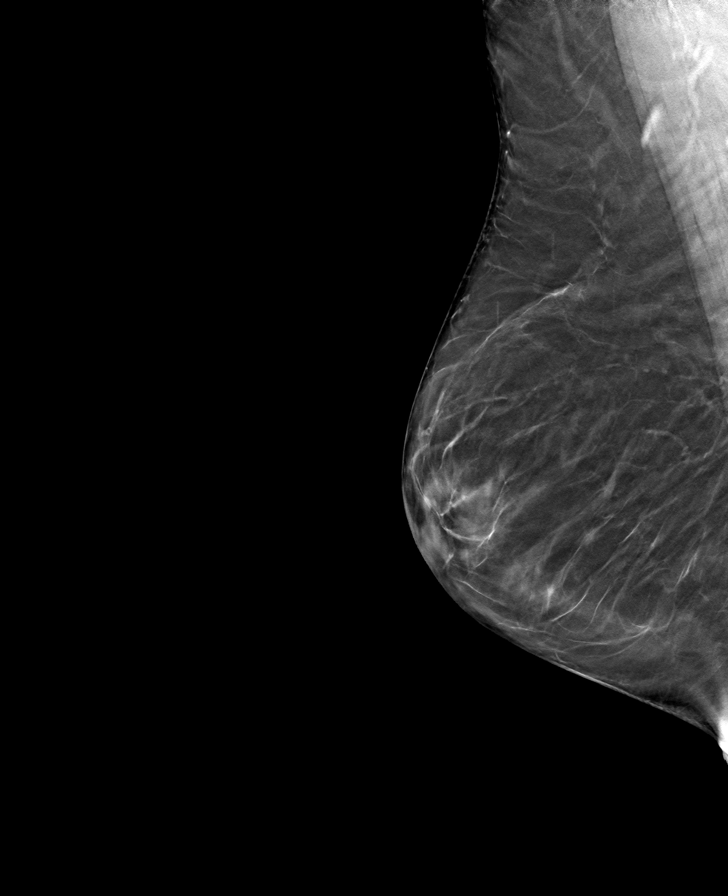

[L CC tomo · tomo slice 33/64.0]
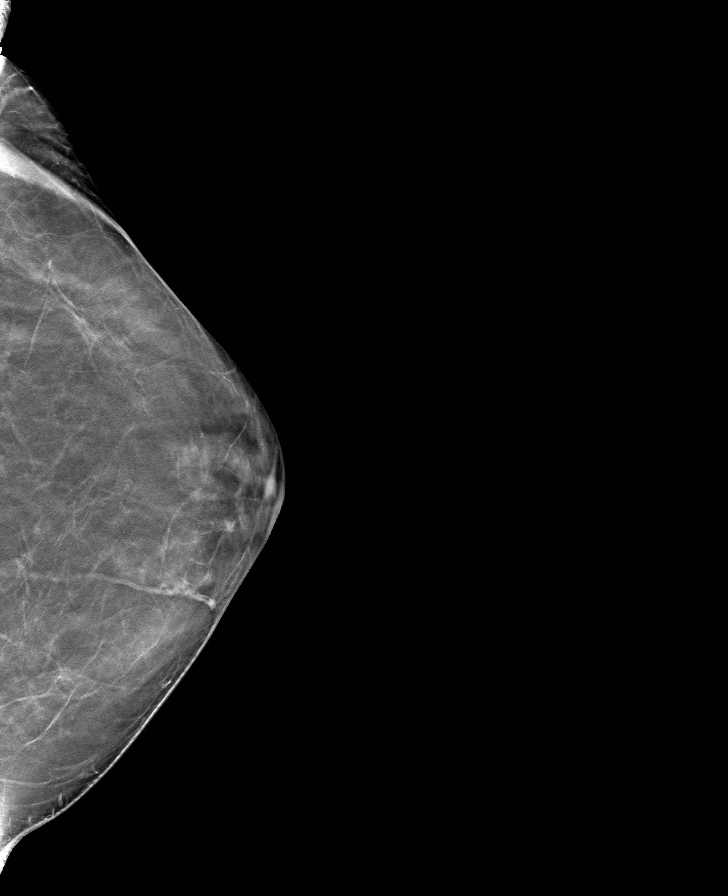

[L MLO tomo · tomo slice 31/62.0]
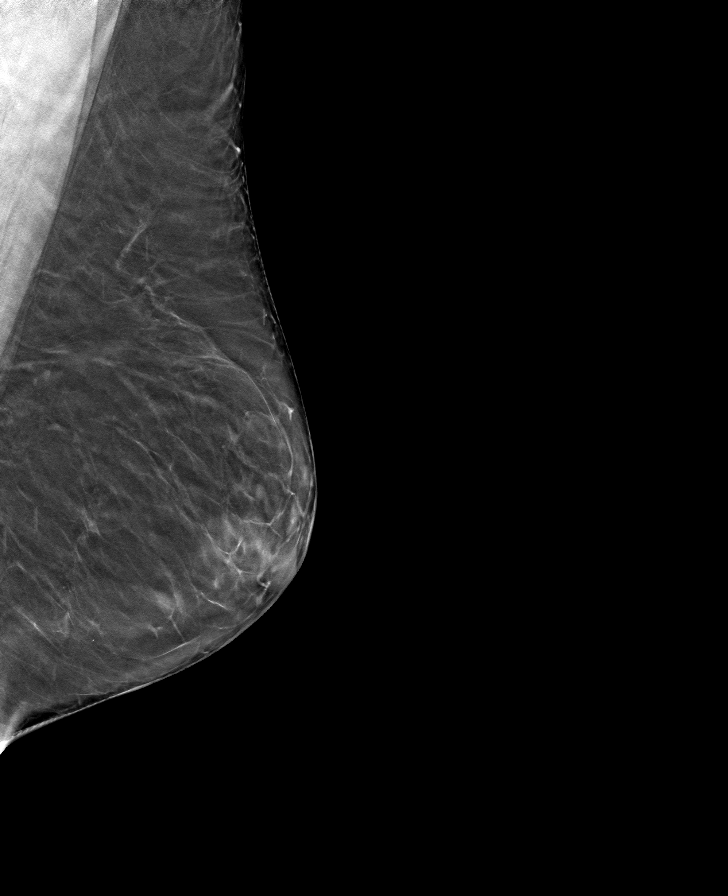

[R CC tomo · tomo slice 31/60.0]
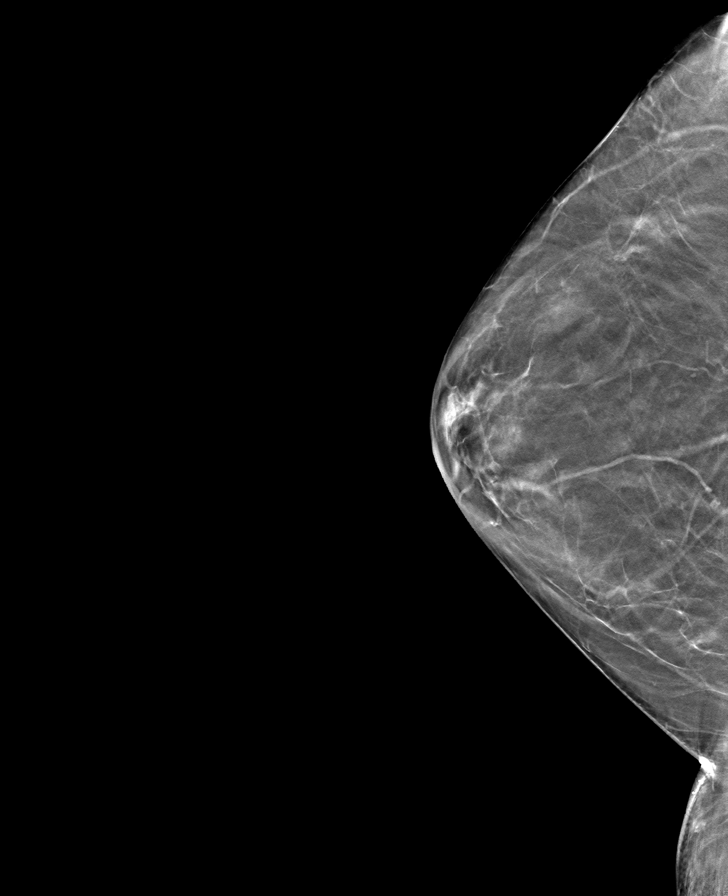

[8 of 24 positions shown; findings below may reference images not displayed]

ACR Breast Density Category b: There are scattered areas of
fibroglandular density.
FINDINGS: In the right breast, possible distortion warrants further
evaluation. In the left breast, no findings suspicious for
malignancy. Images were processed with CAD.
IMPRESSION: Further evaluation is suggested for possible distortion in the right
breast.

RECOMMENDATION:
Diagnostic mammogram and possibly ultrasound of the right breast.
(Code:[id number]-Z-[id number])

The patient will be contacted regarding the findings, and additional
imaging will be scheduled.

BI-RADS CATEGORY  0: Incomplete. Need additional imaging evaluation
and/or prior mammograms for comparison.

## 2022-05-30 DIAGNOSIS — I129 Hypertensive chronic kidney disease with stage 1 through stage 4 chronic kidney disease, or unspecified chronic kidney disease: Secondary | ICD-10-CM | POA: Diagnosis not present

## 2022-05-30 DIAGNOSIS — E1122 Type 2 diabetes mellitus with diabetic chronic kidney disease: Secondary | ICD-10-CM | POA: Diagnosis not present

## 2022-05-30 DIAGNOSIS — E785 Hyperlipidemia, unspecified: Secondary | ICD-10-CM | POA: Diagnosis not present

## 2022-05-30 DIAGNOSIS — E559 Vitamin D deficiency, unspecified: Secondary | ICD-10-CM | POA: Diagnosis not present

## 2022-05-30 DIAGNOSIS — Z6837 Body mass index (BMI) 37.0-37.9, adult: Secondary | ICD-10-CM | POA: Diagnosis not present

## 2022-05-30 DIAGNOSIS — N182 Chronic kidney disease, stage 2 (mild): Secondary | ICD-10-CM | POA: Diagnosis not present

## 2022-06-14 DIAGNOSIS — D485 Neoplasm of uncertain behavior of skin: Secondary | ICD-10-CM | POA: Diagnosis not present

## 2022-06-14 DIAGNOSIS — L814 Other melanin hyperpigmentation: Secondary | ICD-10-CM | POA: Diagnosis not present

## 2022-06-14 DIAGNOSIS — L821 Other seborrheic keratosis: Secondary | ICD-10-CM | POA: Diagnosis not present

## 2022-06-14 DIAGNOSIS — L570 Actinic keratosis: Secondary | ICD-10-CM | POA: Diagnosis not present

## 2022-06-14 DIAGNOSIS — L578 Other skin changes due to chronic exposure to nonionizing radiation: Secondary | ICD-10-CM | POA: Diagnosis not present

## 2022-07-21 DIAGNOSIS — D0439 Carcinoma in situ of skin of other parts of face: Secondary | ICD-10-CM | POA: Diagnosis not present

## 2022-08-08 ENCOUNTER — Other Ambulatory Visit: Payer: Self-pay | Admitting: Internal Medicine

## 2022-08-08 DIAGNOSIS — Z1231 Encounter for screening mammogram for malignant neoplasm of breast: Secondary | ICD-10-CM

## 2022-08-15 DIAGNOSIS — E113293 Type 2 diabetes mellitus with mild nonproliferative diabetic retinopathy without macular edema, bilateral: Secondary | ICD-10-CM | POA: Diagnosis not present

## 2022-08-15 DIAGNOSIS — H26493 Other secondary cataract, bilateral: Secondary | ICD-10-CM | POA: Diagnosis not present

## 2022-08-15 DIAGNOSIS — H04123 Dry eye syndrome of bilateral lacrimal glands: Secondary | ICD-10-CM | POA: Diagnosis not present

## 2022-08-15 DIAGNOSIS — Z7984 Long term (current) use of oral hypoglycemic drugs: Secondary | ICD-10-CM | POA: Diagnosis not present

## 2022-08-15 DIAGNOSIS — H04332 Acute lacrimal canaliculitis of left lacrimal passage: Secondary | ICD-10-CM | POA: Diagnosis not present

## 2022-08-15 DIAGNOSIS — Z961 Presence of intraocular lens: Secondary | ICD-10-CM | POA: Diagnosis not present

## 2022-08-15 DIAGNOSIS — H43393 Other vitreous opacities, bilateral: Secondary | ICD-10-CM | POA: Diagnosis not present

## 2022-09-17 ENCOUNTER — Ambulatory Visit: Payer: PPO

## 2022-10-02 DIAGNOSIS — N132 Hydronephrosis with renal and ureteral calculous obstruction: Secondary | ICD-10-CM | POA: Diagnosis not present

## 2022-10-02 DIAGNOSIS — R000 Tachycardia, unspecified: Secondary | ICD-10-CM | POA: Diagnosis not present

## 2022-10-02 DIAGNOSIS — N1330 Unspecified hydronephrosis: Secondary | ICD-10-CM | POA: Diagnosis not present

## 2022-10-02 DIAGNOSIS — M47816 Spondylosis without myelopathy or radiculopathy, lumbar region: Secondary | ICD-10-CM | POA: Diagnosis not present

## 2022-10-02 DIAGNOSIS — Z87442 Personal history of urinary calculi: Secondary | ICD-10-CM | POA: Diagnosis not present

## 2022-10-02 DIAGNOSIS — R0602 Shortness of breath: Secondary | ICD-10-CM | POA: Diagnosis not present

## 2022-10-02 DIAGNOSIS — Z79899 Other long term (current) drug therapy: Secondary | ICD-10-CM | POA: Diagnosis not present

## 2022-10-02 DIAGNOSIS — B9620 Unspecified Escherichia coli [E. coli] as the cause of diseases classified elsewhere: Secondary | ICD-10-CM | POA: Diagnosis not present

## 2022-10-02 DIAGNOSIS — Z8542 Personal history of malignant neoplasm of other parts of uterus: Secondary | ICD-10-CM | POA: Diagnosis not present

## 2022-10-02 DIAGNOSIS — K5730 Diverticulosis of large intestine without perforation or abscess without bleeding: Secondary | ICD-10-CM | POA: Diagnosis not present

## 2022-10-02 DIAGNOSIS — Z7984 Long term (current) use of oral hypoglycemic drugs: Secondary | ICD-10-CM | POA: Diagnosis not present

## 2022-10-02 DIAGNOSIS — D72829 Elevated white blood cell count, unspecified: Secondary | ICD-10-CM | POA: Diagnosis not present

## 2022-10-02 DIAGNOSIS — B001 Herpesviral vesicular dermatitis: Secondary | ICD-10-CM | POA: Diagnosis not present

## 2022-10-02 DIAGNOSIS — Z885 Allergy status to narcotic agent status: Secondary | ICD-10-CM | POA: Diagnosis not present

## 2022-10-02 DIAGNOSIS — N12 Tubulo-interstitial nephritis, not specified as acute or chronic: Secondary | ICD-10-CM | POA: Diagnosis not present

## 2022-10-02 DIAGNOSIS — N136 Pyonephrosis: Secondary | ICD-10-CM | POA: Diagnosis not present

## 2022-10-02 DIAGNOSIS — I10 Essential (primary) hypertension: Secondary | ICD-10-CM | POA: Diagnosis not present

## 2022-10-02 DIAGNOSIS — Z466 Encounter for fitting and adjustment of urinary device: Secondary | ICD-10-CM | POA: Diagnosis not present

## 2022-10-02 DIAGNOSIS — E119 Type 2 diabetes mellitus without complications: Secondary | ICD-10-CM | POA: Diagnosis not present

## 2022-10-02 DIAGNOSIS — N200 Calculus of kidney: Secondary | ICD-10-CM | POA: Diagnosis not present

## 2022-10-02 DIAGNOSIS — E7800 Pure hypercholesterolemia, unspecified: Secondary | ICD-10-CM | POA: Diagnosis not present

## 2022-10-02 DIAGNOSIS — R188 Other ascites: Secondary | ICD-10-CM | POA: Diagnosis not present

## 2022-10-02 DIAGNOSIS — R6520 Severe sepsis without septic shock: Secondary | ICD-10-CM | POA: Diagnosis not present

## 2022-10-02 DIAGNOSIS — E872 Acidosis, unspecified: Secondary | ICD-10-CM | POA: Diagnosis not present

## 2022-10-02 DIAGNOSIS — A419 Sepsis, unspecified organism: Secondary | ICD-10-CM | POA: Diagnosis not present

## 2022-10-02 DIAGNOSIS — N179 Acute kidney failure, unspecified: Secondary | ICD-10-CM | POA: Diagnosis not present

## 2022-10-02 DIAGNOSIS — Z85828 Personal history of other malignant neoplasm of skin: Secondary | ICD-10-CM | POA: Diagnosis not present

## 2022-10-08 DIAGNOSIS — N182 Chronic kidney disease, stage 2 (mild): Secondary | ICD-10-CM | POA: Diagnosis not present

## 2022-10-08 DIAGNOSIS — E1122 Type 2 diabetes mellitus with diabetic chronic kidney disease: Secondary | ICD-10-CM | POA: Diagnosis not present

## 2022-10-08 DIAGNOSIS — B009 Herpesviral infection, unspecified: Secondary | ICD-10-CM | POA: Diagnosis not present

## 2022-10-08 DIAGNOSIS — Z6837 Body mass index (BMI) 37.0-37.9, adult: Secondary | ICD-10-CM | POA: Diagnosis not present

## 2022-10-09 ENCOUNTER — Ambulatory Visit: Payer: PPO

## 2022-10-11 DIAGNOSIS — Z6837 Body mass index (BMI) 37.0-37.9, adult: Secondary | ICD-10-CM | POA: Diagnosis not present

## 2022-10-11 DIAGNOSIS — M792 Neuralgia and neuritis, unspecified: Secondary | ICD-10-CM | POA: Diagnosis not present

## 2022-10-11 DIAGNOSIS — N1330 Unspecified hydronephrosis: Secondary | ICD-10-CM | POA: Diagnosis not present

## 2022-10-11 DIAGNOSIS — A419 Sepsis, unspecified organism: Secondary | ICD-10-CM | POA: Diagnosis not present

## 2022-10-11 DIAGNOSIS — B0089 Other herpesviral infection: Secondary | ICD-10-CM | POA: Diagnosis not present

## 2022-10-17 DIAGNOSIS — A419 Sepsis, unspecified organism: Secondary | ICD-10-CM | POA: Diagnosis not present

## 2022-10-17 DIAGNOSIS — B0089 Other herpesviral infection: Secondary | ICD-10-CM | POA: Diagnosis not present

## 2022-10-17 DIAGNOSIS — N1330 Unspecified hydronephrosis: Secondary | ICD-10-CM | POA: Diagnosis not present

## 2022-10-17 DIAGNOSIS — M792 Neuralgia and neuritis, unspecified: Secondary | ICD-10-CM | POA: Diagnosis not present

## 2022-10-24 DIAGNOSIS — Z466 Encounter for fitting and adjustment of urinary device: Secondary | ICD-10-CM | POA: Diagnosis not present

## 2022-10-24 DIAGNOSIS — N200 Calculus of kidney: Secondary | ICD-10-CM | POA: Diagnosis not present

## 2022-10-24 DIAGNOSIS — Z87442 Personal history of urinary calculi: Secondary | ICD-10-CM | POA: Diagnosis not present

## 2022-11-06 DIAGNOSIS — R609 Edema, unspecified: Secondary | ICD-10-CM | POA: Diagnosis not present

## 2022-11-06 DIAGNOSIS — R3989 Other symptoms and signs involving the genitourinary system: Secondary | ICD-10-CM | POA: Diagnosis not present

## 2022-11-06 DIAGNOSIS — R251 Tremor, unspecified: Secondary | ICD-10-CM | POA: Diagnosis not present

## 2022-11-06 DIAGNOSIS — R490 Dysphonia: Secondary | ICD-10-CM | POA: Diagnosis not present

## 2022-11-06 DIAGNOSIS — Z6835 Body mass index (BMI) 35.0-35.9, adult: Secondary | ICD-10-CM | POA: Diagnosis not present

## 2022-11-14 DIAGNOSIS — N390 Urinary tract infection, site not specified: Secondary | ICD-10-CM | POA: Diagnosis not present

## 2022-11-20 ENCOUNTER — Ambulatory Visit: Payer: PPO

## 2022-11-21 DIAGNOSIS — R1032 Left lower quadrant pain: Secondary | ICD-10-CM | POA: Diagnosis not present

## 2022-11-21 DIAGNOSIS — N200 Calculus of kidney: Secondary | ICD-10-CM | POA: Diagnosis not present

## 2022-11-22 DIAGNOSIS — Z466 Encounter for fitting and adjustment of urinary device: Secondary | ICD-10-CM | POA: Diagnosis not present

## 2022-11-22 DIAGNOSIS — Z87442 Personal history of urinary calculi: Secondary | ICD-10-CM | POA: Diagnosis not present

## 2022-11-22 DIAGNOSIS — N200 Calculus of kidney: Secondary | ICD-10-CM | POA: Diagnosis not present

## 2022-11-29 DIAGNOSIS — N200 Calculus of kidney: Secondary | ICD-10-CM | POA: Diagnosis not present

## 2022-11-29 DIAGNOSIS — Z7984 Long term (current) use of oral hypoglycemic drugs: Secondary | ICD-10-CM | POA: Diagnosis not present

## 2022-11-29 DIAGNOSIS — E119 Type 2 diabetes mellitus without complications: Secondary | ICD-10-CM | POA: Diagnosis not present

## 2022-12-04 DIAGNOSIS — E1140 Type 2 diabetes mellitus with diabetic neuropathy, unspecified: Secondary | ICD-10-CM | POA: Diagnosis not present

## 2022-12-04 DIAGNOSIS — N182 Chronic kidney disease, stage 2 (mild): Secondary | ICD-10-CM | POA: Diagnosis not present

## 2022-12-04 DIAGNOSIS — E1122 Type 2 diabetes mellitus with diabetic chronic kidney disease: Secondary | ICD-10-CM | POA: Diagnosis not present

## 2022-12-04 DIAGNOSIS — E559 Vitamin D deficiency, unspecified: Secondary | ICD-10-CM | POA: Diagnosis not present

## 2022-12-04 DIAGNOSIS — Z6835 Body mass index (BMI) 35.0-35.9, adult: Secondary | ICD-10-CM | POA: Diagnosis not present

## 2022-12-04 DIAGNOSIS — I129 Hypertensive chronic kidney disease with stage 1 through stage 4 chronic kidney disease, or unspecified chronic kidney disease: Secondary | ICD-10-CM | POA: Diagnosis not present

## 2022-12-04 DIAGNOSIS — E785 Hyperlipidemia, unspecified: Secondary | ICD-10-CM | POA: Diagnosis not present

## 2022-12-06 DIAGNOSIS — Z466 Encounter for fitting and adjustment of urinary device: Secondary | ICD-10-CM | POA: Diagnosis not present

## 2022-12-06 DIAGNOSIS — S56119A Strain of flexor muscle, fascia and tendon of finger of unspecified finger at forearm level, initial encounter: Secondary | ICD-10-CM | POA: Diagnosis not present

## 2022-12-06 DIAGNOSIS — N200 Calculus of kidney: Secondary | ICD-10-CM | POA: Diagnosis not present

## 2022-12-06 DIAGNOSIS — Z955 Presence of coronary angioplasty implant and graft: Secondary | ICD-10-CM | POA: Diagnosis not present

## 2022-12-06 DIAGNOSIS — R1031 Right lower quadrant pain: Secondary | ICD-10-CM | POA: Diagnosis not present

## 2022-12-06 DIAGNOSIS — Z960 Presence of urogenital implants: Secondary | ICD-10-CM | POA: Diagnosis not present

## 2022-12-12 DIAGNOSIS — L821 Other seborrheic keratosis: Secondary | ICD-10-CM | POA: Diagnosis not present

## 2022-12-12 DIAGNOSIS — D0439 Carcinoma in situ of skin of other parts of face: Secondary | ICD-10-CM | POA: Diagnosis not present

## 2023-01-04 DIAGNOSIS — R1031 Right lower quadrant pain: Secondary | ICD-10-CM | POA: Diagnosis not present

## 2023-01-04 DIAGNOSIS — N200 Calculus of kidney: Secondary | ICD-10-CM | POA: Diagnosis not present

## 2023-01-18 DIAGNOSIS — Z6835 Body mass index (BMI) 35.0-35.9, adult: Secondary | ICD-10-CM | POA: Diagnosis not present

## 2023-01-18 DIAGNOSIS — J329 Chronic sinusitis, unspecified: Secondary | ICD-10-CM | POA: Diagnosis not present

## 2023-01-18 DIAGNOSIS — H6120 Impacted cerumen, unspecified ear: Secondary | ICD-10-CM | POA: Diagnosis not present

## 2023-03-05 DIAGNOSIS — H7401 Tympanosclerosis, right ear: Secondary | ICD-10-CM | POA: Diagnosis not present

## 2023-03-05 DIAGNOSIS — H9193 Unspecified hearing loss, bilateral: Secondary | ICD-10-CM | POA: Diagnosis not present

## 2023-03-05 DIAGNOSIS — H6121 Impacted cerumen, right ear: Secondary | ICD-10-CM | POA: Diagnosis not present

## 2023-03-14 DIAGNOSIS — H26493 Other secondary cataract, bilateral: Secondary | ICD-10-CM | POA: Diagnosis not present

## 2023-03-14 DIAGNOSIS — H04123 Dry eye syndrome of bilateral lacrimal glands: Secondary | ICD-10-CM | POA: Diagnosis not present

## 2023-03-14 DIAGNOSIS — H43393 Other vitreous opacities, bilateral: Secondary | ICD-10-CM | POA: Diagnosis not present

## 2023-03-14 DIAGNOSIS — E113293 Type 2 diabetes mellitus with mild nonproliferative diabetic retinopathy without macular edema, bilateral: Secondary | ICD-10-CM | POA: Diagnosis not present

## 2023-03-14 DIAGNOSIS — Z7984 Long term (current) use of oral hypoglycemic drugs: Secondary | ICD-10-CM | POA: Diagnosis not present

## 2023-03-28 ENCOUNTER — Ambulatory Visit
Admission: RE | Admit: 2023-03-28 | Discharge: 2023-03-28 | Disposition: A | Discharge status: Home / Self Care | Payer: PPO | Source: Ambulatory Visit | Attending: Internal Medicine | Admitting: Internal Medicine

## 2023-03-28 DIAGNOSIS — Z1231 Encounter for screening mammogram for malignant neoplasm of breast: Secondary | ICD-10-CM | POA: Diagnosis not present

## 2023-04-12 DIAGNOSIS — N200 Calculus of kidney: Secondary | ICD-10-CM | POA: Diagnosis not present

## 2023-04-12 DIAGNOSIS — R1031 Right lower quadrant pain: Secondary | ICD-10-CM | POA: Diagnosis not present

## 2023-06-18 DIAGNOSIS — Z6837 Body mass index (BMI) 37.0-37.9, adult: Secondary | ICD-10-CM | POA: Diagnosis not present

## 2023-06-18 DIAGNOSIS — E785 Hyperlipidemia, unspecified: Secondary | ICD-10-CM | POA: Diagnosis not present

## 2023-06-18 DIAGNOSIS — I129 Hypertensive chronic kidney disease with stage 1 through stage 4 chronic kidney disease, or unspecified chronic kidney disease: Secondary | ICD-10-CM | POA: Diagnosis not present

## 2023-06-18 DIAGNOSIS — E1122 Type 2 diabetes mellitus with diabetic chronic kidney disease: Secondary | ICD-10-CM | POA: Diagnosis not present

## 2023-06-18 DIAGNOSIS — E559 Vitamin D deficiency, unspecified: Secondary | ICD-10-CM | POA: Diagnosis not present

## 2023-06-18 DIAGNOSIS — N182 Chronic kidney disease, stage 2 (mild): Secondary | ICD-10-CM | POA: Diagnosis not present

## 2023-06-18 DIAGNOSIS — Z1331 Encounter for screening for depression: Secondary | ICD-10-CM | POA: Diagnosis not present

## 2023-06-18 DIAGNOSIS — Z9181 History of falling: Secondary | ICD-10-CM | POA: Diagnosis not present

## 2023-06-18 DIAGNOSIS — E1140 Type 2 diabetes mellitus with diabetic neuropathy, unspecified: Secondary | ICD-10-CM | POA: Diagnosis not present

## 2023-12-16 DIAGNOSIS — E559 Vitamin D deficiency, unspecified: Secondary | ICD-10-CM | POA: Diagnosis not present

## 2023-12-16 DIAGNOSIS — Z23 Encounter for immunization: Secondary | ICD-10-CM | POA: Diagnosis not present

## 2023-12-16 DIAGNOSIS — N182 Chronic kidney disease, stage 2 (mild): Secondary | ICD-10-CM | POA: Diagnosis not present

## 2023-12-16 DIAGNOSIS — E785 Hyperlipidemia, unspecified: Secondary | ICD-10-CM | POA: Diagnosis not present

## 2023-12-16 DIAGNOSIS — Z6835 Body mass index (BMI) 35.0-35.9, adult: Secondary | ICD-10-CM | POA: Diagnosis not present

## 2023-12-16 DIAGNOSIS — I129 Hypertensive chronic kidney disease with stage 1 through stage 4 chronic kidney disease, or unspecified chronic kidney disease: Secondary | ICD-10-CM | POA: Diagnosis not present

## 2023-12-16 DIAGNOSIS — E1122 Type 2 diabetes mellitus with diabetic chronic kidney disease: Secondary | ICD-10-CM | POA: Diagnosis not present

## 2023-12-19 DIAGNOSIS — E875 Hyperkalemia: Secondary | ICD-10-CM | POA: Diagnosis not present

## 2023-12-23 DIAGNOSIS — Z0000 Encounter for general adult medical examination without abnormal findings: Secondary | ICD-10-CM | POA: Diagnosis not present

## 2023-12-23 DIAGNOSIS — Z9181 History of falling: Secondary | ICD-10-CM | POA: Diagnosis not present

## 2024-02-26 ENCOUNTER — Other Ambulatory Visit: Payer: Self-pay | Admitting: Internal Medicine

## 2024-02-26 DIAGNOSIS — Z0000 Encounter for general adult medical examination without abnormal findings: Secondary | ICD-10-CM

## 2024-04-01 ENCOUNTER — Ambulatory Visit
Admission: RE | Admit: 2024-04-01 | Discharge: 2024-04-01 | Disposition: A | Discharge status: Home / Self Care | Source: Ambulatory Visit | Attending: Internal Medicine | Admitting: Internal Medicine

## 2024-04-01 DIAGNOSIS — Z0000 Encounter for general adult medical examination without abnormal findings: Secondary | ICD-10-CM

## 2024-04-01 DIAGNOSIS — Z1231 Encounter for screening mammogram for malignant neoplasm of breast: Secondary | ICD-10-CM | POA: Diagnosis not present

## 2024-05-20 ENCOUNTER — Other Ambulatory Visit: Payer: Self-pay | Admitting: Internal Medicine

## 2024-05-20 DIAGNOSIS — Z1231 Encounter for screening mammogram for malignant neoplasm of breast: Secondary | ICD-10-CM

## 2024-06-15 DIAGNOSIS — N183 Chronic kidney disease, stage 3 unspecified: Secondary | ICD-10-CM | POA: Diagnosis not present

## 2024-06-15 DIAGNOSIS — I129 Hypertensive chronic kidney disease with stage 1 through stage 4 chronic kidney disease, or unspecified chronic kidney disease: Secondary | ICD-10-CM | POA: Diagnosis not present

## 2024-06-15 DIAGNOSIS — E1122 Type 2 diabetes mellitus with diabetic chronic kidney disease: Secondary | ICD-10-CM | POA: Diagnosis not present

## 2024-06-15 DIAGNOSIS — E559 Vitamin D deficiency, unspecified: Secondary | ICD-10-CM | POA: Diagnosis not present

## 2024-06-15 DIAGNOSIS — E785 Hyperlipidemia, unspecified: Secondary | ICD-10-CM | POA: Diagnosis not present

## 2024-08-07 DIAGNOSIS — H524 Presbyopia: Secondary | ICD-10-CM | POA: Diagnosis not present

## 2024-08-07 DIAGNOSIS — H26493 Other secondary cataract, bilateral: Secondary | ICD-10-CM | POA: Diagnosis not present

## 2024-08-07 DIAGNOSIS — S0502XA Injury of conjunctiva and corneal abrasion without foreign body, left eye, initial encounter: Secondary | ICD-10-CM | POA: Diagnosis not present

## 2024-08-07 DIAGNOSIS — H43393 Other vitreous opacities, bilateral: Secondary | ICD-10-CM | POA: Diagnosis not present

## 2024-08-07 DIAGNOSIS — E113293 Type 2 diabetes mellitus with mild nonproliferative diabetic retinopathy without macular edema, bilateral: Secondary | ICD-10-CM | POA: Diagnosis not present

## 2024-08-07 DIAGNOSIS — H04123 Dry eye syndrome of bilateral lacrimal glands: Secondary | ICD-10-CM | POA: Diagnosis not present

## 2024-08-07 DIAGNOSIS — Z7984 Long term (current) use of oral hypoglycemic drugs: Secondary | ICD-10-CM | POA: Diagnosis not present

## 2024-08-10 DIAGNOSIS — H43393 Other vitreous opacities, bilateral: Secondary | ICD-10-CM | POA: Diagnosis not present

## 2024-08-10 DIAGNOSIS — H26493 Other secondary cataract, bilateral: Secondary | ICD-10-CM | POA: Diagnosis not present

## 2024-08-10 DIAGNOSIS — Z7984 Long term (current) use of oral hypoglycemic drugs: Secondary | ICD-10-CM | POA: Diagnosis not present

## 2024-08-10 DIAGNOSIS — H04123 Dry eye syndrome of bilateral lacrimal glands: Secondary | ICD-10-CM | POA: Diagnosis not present

## 2024-08-10 DIAGNOSIS — S0502XA Injury of conjunctiva and corneal abrasion without foreign body, left eye, initial encounter: Secondary | ICD-10-CM | POA: Diagnosis not present

## 2024-08-10 DIAGNOSIS — H524 Presbyopia: Secondary | ICD-10-CM | POA: Diagnosis not present

## 2024-08-10 DIAGNOSIS — E113293 Type 2 diabetes mellitus with mild nonproliferative diabetic retinopathy without macular edema, bilateral: Secondary | ICD-10-CM | POA: Diagnosis not present

## 2024-09-21 DIAGNOSIS — Z7984 Long term (current) use of oral hypoglycemic drugs: Secondary | ICD-10-CM | POA: Diagnosis not present

## 2024-09-21 DIAGNOSIS — H18832 Recurrent erosion of cornea, left eye: Secondary | ICD-10-CM | POA: Diagnosis not present

## 2024-09-21 DIAGNOSIS — H04123 Dry eye syndrome of bilateral lacrimal glands: Secondary | ICD-10-CM | POA: Diagnosis not present

## 2024-09-21 DIAGNOSIS — H524 Presbyopia: Secondary | ICD-10-CM | POA: Diagnosis not present

## 2024-09-21 DIAGNOSIS — H43393 Other vitreous opacities, bilateral: Secondary | ICD-10-CM | POA: Diagnosis not present

## 2024-09-21 DIAGNOSIS — E113293 Type 2 diabetes mellitus with mild nonproliferative diabetic retinopathy without macular edema, bilateral: Secondary | ICD-10-CM | POA: Diagnosis not present

## 2024-09-21 DIAGNOSIS — H26493 Other secondary cataract, bilateral: Secondary | ICD-10-CM | POA: Diagnosis not present

## 2025-05-05 ENCOUNTER — Inpatient Hospital Stay: Admission: RE | Admit: 2025-05-05 | Source: Ambulatory Visit
